# Patient Record
Sex: Male | Born: 1975 | Marital: Married | Smoking: Never smoker
Health system: Southern US, Community
[De-identification: ages and names within clinical notes are randomized; demographics above are authoritative.]

---

## 2013-07-09 ENCOUNTER — Telehealth: Payer: Self-pay | Admitting: *Deleted

## 2013-07-09 ENCOUNTER — Ambulatory Visit (INDEPENDENT_AMBULATORY_CARE_PROVIDER_SITE_OTHER): Payer: 59

## 2013-07-09 ENCOUNTER — Encounter: Payer: Self-pay | Admitting: Sports Medicine

## 2013-07-09 ENCOUNTER — Ambulatory Visit (INDEPENDENT_AMBULATORY_CARE_PROVIDER_SITE_OTHER): Payer: 59 | Admitting: Sports Medicine

## 2013-07-09 VITALS — BP 149/92 | HR 101 | Ht 68.0 in | Wt 234.0 lb

## 2013-07-09 DIAGNOSIS — M545 Low back pain, unspecified: Secondary | ICD-10-CM

## 2013-07-09 DIAGNOSIS — R509 Fever, unspecified: Secondary | ICD-10-CM

## 2013-07-09 DIAGNOSIS — M5416 Radiculopathy, lumbar region: Secondary | ICD-10-CM | POA: Insufficient documentation

## 2013-07-09 MED ORDER — KETOROLAC TROMETHAMINE 30 MG/ML IJ SOLN
30.0000 mg | Freq: Once | INTRAMUSCULAR | Status: AC
  Administered 2013-07-09: 30 mg via INTRAMUSCULAR

## 2013-07-09 MED ORDER — CYCLOBENZAPRINE HCL 10 MG PO TABS: ORAL_TABLET | ORAL | Status: DC

## 2013-07-09 MED ORDER — METHYLPREDNISOLONE SODIUM SUCC 125 MG IJ SOLR
250.0000 mg | Freq: Once | INTRAMUSCULAR | Status: AC
  Administered 2013-07-09: 250 mg via INTRAMUSCULAR

## 2013-07-09 MED ORDER — TRAMADOL HCL 50 MG PO TABS: ORAL_TABLET | ORAL | Status: DC

## 2013-07-09 MED ORDER — MELOXICAM 15 MG PO TABS: ORAL_TABLET | ORAL | Status: DC

## 2013-07-09 MED ORDER — PREDNISONE 50 MG PO TABS: ORAL_TABLET | ORAL | Status: DC

## 2013-07-09 NOTE — Telephone Encounter (Signed)
MRI Lumbar with and w/o Auth # [id number].  Meyer CoryMisty Ahmad, LPN

## 2013-07-09 NOTE — Progress Notes (Signed)
  Subjective:    CC: Low back pain   HPI:  This is a very pleasant 38 year old male with a history of lumbar degenerative disc disease status post discectomy, for the past several days he said exquisite pain that he localizes in the lower back with radiation down the right leg. He does not endorse bowel or bladder dysfunction, he does tell me he has some fevers and chills, is also weak in his lower extremities. No saddle numbness. Symptoms are severe, persistent. No trauma, pain is worse when sitting, with also.  Past medical history, Surgical history, Family history not pertinant except as noted below, Social history, Allergies, and medications have been entered into the medical record, reviewed, and no changes needed.   Review of Systems: No headache, visual changes, nausea, vomiting, diarrhea, constipation, dizziness, abdominal pain, skin rash, fevers, chills, night sweats, swollen lymph nodes, weight loss, chest pain, body aches, joint swelling, muscle aches, shortness of breath, mood changes, visual or auditory hallucinations.  Objective:    General: Well Developed, well nourished, and in no acute distress.  Neuro: Alert and oriented x3, extra-ocular muscles intact, sensation grossly intact.  HEENT: Normocephalic, atraumatic, pupils equal round reactive to light, neck supple, no masses, no lymphadenopathy, thyroid nonpalpable.  Skin: Warm and dry, no rashes noted.  Cardiac: Regular rate and rhythm, no murmurs rubs or gallops.  Respiratory: Clear to auscultation bilaterally. Not using accessory muscles, speaking in full sentences.  Abdominal: Soft, nontender, nondistended, positive bowel sounds, no masses, no organomegaly.  Low back: Due to exquisite back pain, exam was somewhat limited, he did have a positive straight leg raising a positive cross straight leg raise, patellar tendon reflexes were good, Achilles tendon reflexes were nonexistent, he did have positive Babinski's bilaterally.  Good lower extremity strength, some hypoesthesia in the right lower extremity.  Lumbar spine x-rays are negative.  Impression and Recommendations:    The patient was counselled, risk factors were discussed, anticipatory guidance given.

## 2013-07-09 NOTE — Assessment & Plan Note (Signed)
Pain is currently severe with right-sided radicular symptoms, positive Babinski's. 30 of Toradol, 250 a Solu-Medrol. Flexeril at bedtime. X-rays, basic metabolic panel I am going to obtain an early MRI with IV contrast due to a prior discectomy. Return to see me go over results of the MRI.

## 2013-07-10 ENCOUNTER — Encounter: Payer: Self-pay | Admitting: Sports Medicine

## 2013-07-10 ENCOUNTER — Ambulatory Visit (INDEPENDENT_AMBULATORY_CARE_PROVIDER_SITE_OTHER): Payer: 59 | Admitting: Sports Medicine

## 2013-07-10 ENCOUNTER — Ambulatory Visit (INDEPENDENT_AMBULATORY_CARE_PROVIDER_SITE_OTHER): Payer: 59

## 2013-07-10 VITALS — BP 156/90 | HR 91 | Ht 68.0 in | Wt 237.0 lb

## 2013-07-10 DIAGNOSIS — M545 Low back pain, unspecified: Secondary | ICD-10-CM

## 2013-07-10 MED ORDER — OXYCODONE-ACETAMINOPHEN 10-325 MG PO TABS: 1.0000 | ORAL_TABLET | Freq: Three times a day (TID) | ORAL | Status: DC | PRN

## 2013-07-10 MED ORDER — KETOROLAC TROMETHAMINE 30 MG/ML IJ SOLN
30.0000 mg | Freq: Once | INTRAMUSCULAR | Status: AC
  Administered 2013-07-10: 30 mg via INTRAMUSCULAR

## 2013-07-10 MED ORDER — GADOBENATE DIMEGLUMINE 529 MG/ML IV SOLN: 20.0000 mL | Freq: Once | INTRAVENOUS | Status: AC | PRN

## 2013-07-10 NOTE — Addendum Note (Signed)
Addended by: Pixie CasinoUNNINGHAM, Harnoor Kohles C on: 07/10/2013 04:37 PM   Modules accepted: Orders

## 2013-07-10 NOTE — Assessment & Plan Note (Signed)
MRI does confirm a large right-sided L5-S1 disc protrusion. Fortunately there is no sign of cauda equina syndrome or spinal cord compression. Toradol was tremendously useful yesterday, we are going to repeat this. Note out of work for this week, Percocet, return to see me in 2 weeks, if no better certainly we would need to consider a right-sided L5-S1 interlaminar epidural steroid injection.

## 2013-07-10 NOTE — Progress Notes (Signed)
  Subjective:    CC: MRI results  HPI: Jamie Watts is a very pleasant 38 year old male who was referred to me by another one of my patients. I saw him yesterday with severe back pain, he did have a positive Babinski sign. We obtained an early MRI due to these physical exam findings, he has a history of an L5-S1 microdiscectomy. Overall he is improved by about 10% yesterday with steroids, NSAIDs, muscle relaxers. He is continuing to be severe and persistent.  Past medical history, Surgical history, Family history not pertinant except as noted below, Social history, Allergies, and medications have been entered into the medical record, reviewed, and no changes needed.   Review of Systems: No fevers, chills, night sweats, weight loss, chest pain, or shortness of breath.   Objective:    General: Well Developed, well nourished, and in no acute distress.  Neuro: Alert and oriented x3, extra-ocular muscles intact, sensation grossly intact.  HEENT: Normocephalic, atraumatic, pupils equal round reactive to light, neck supple, no masses, no lymphadenopathy, thyroid nonpalpable.  Skin: Warm and dry, no rashes. Cardiac: Regular rate and rhythm, no murmurs rubs or gallops, no lower extremity edema.  Respiratory: Clear to auscultation bilaterally. Not using accessory muscles, speaking in full sentences.  MRI did show recurrence of this protrusion on the right at the L5-S1 level, protruding in to the neural foramen as well as causing mild central canal and lateral recess stenosis.  Toradol 30 mg intramuscular given today.  Impression and Recommendations:

## 2013-07-20 ENCOUNTER — Encounter: Payer: Self-pay | Admitting: Sports Medicine

## 2013-07-20 ENCOUNTER — Ambulatory Visit (INDEPENDENT_AMBULATORY_CARE_PROVIDER_SITE_OTHER): Payer: 59 | Admitting: Sports Medicine

## 2013-07-20 VITALS — BP 138/97 | HR 84 | Ht 68.0 in | Wt 228.0 lb

## 2013-07-20 DIAGNOSIS — IMO0002 Reserved for concepts with insufficient information to code with codable children: Secondary | ICD-10-CM

## 2013-07-20 DIAGNOSIS — M5416 Radiculopathy, lumbar region: Secondary | ICD-10-CM

## 2013-07-20 NOTE — Assessment & Plan Note (Signed)
Fantastic response to Flexeril,  Percocet, Mobic, and high-dose steroids. At this point he continues to improve, still gets occasional radicular symptoms down the right leg in an L5 distribution. We are not going to rock the boat today, I like to see him back in one month if he continues to have symptoms he would be a candidate for a right-sided L5-S1 interlaminar epidural injection. 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

## 2013-07-20 NOTE — Progress Notes (Signed)
  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 He is a history of a L5-S1 microdiscectomy, unfortunately had recurrence of pain, exquisite, with radiation down the right leg. I placed him through Percocet, steroids, NSAIDs, muscle relaxers, and home rehabilitation. Fortunately he has significantly improved, symptoms are also continuing to improve and he is very happy with the results so far. He still has numbness and tingling down the right leg but this also continues to improve.  Past medical history, Surgical history, Family history not pertinant except as noted below, Social history, Allergies, and medications have been entered into the medical record, reviewed, and no changes needed.   Review of Systems: No fevers, chills, night sweats, weight loss, chest pain, or shortness of breath.   Objective:    General: Well Developed, well nourished, and in no acute distress.  Neuro: Alert and oriented x3, extra-ocular muscles intact, sensation grossly intact.  HEENT: Normocephalic, atraumatic, pupils equal round reactive to light, neck supple, no masses, no lymphadenopathy, thyroid nonpalpable.  Skin: Warm and dry, no rashes. Cardiac: Regular rate and rhythm, no murmurs rubs or gallops, no lower extremity edema.  Respiratory: Clear to auscultation bilaterally. Not using accessory muscles, speaking in full sentences. Back Exam:  Inspection: Unremarkable  Motion: Flexion 45 deg, Extension 45 deg, Side Bending to 45 deg bilaterally,  Rotation to 45 deg bilaterally  SLR laying: Negative  XSLR laying: Negative  Palpable tenderness: None. FABER: negative. Sensory change: Gross sensation intact to all lumbar and sacral dermatomes.  Reflexes: 2+ at both patellar tendons, 2+ at achilles tendons, Babinski's downgoing.  Strength at foot  Plantar-flexion: 5/5 Dorsi-flexion: 5/5 Eversion: 5/5 Inversion: 5/5  Leg  strength  Quad: 5/5 Hamstring: 5/5 Hip flexor: 5/5 Hip abductors: 5/5  Gait unremarkable.  Impression and Recommendations:

## 2013-07-27 ENCOUNTER — Encounter: Payer: Self-pay | Admitting: Sports Medicine

## 2013-07-27 ENCOUNTER — Ambulatory Visit (INDEPENDENT_AMBULATORY_CARE_PROVIDER_SITE_OTHER): Payer: 59 | Admitting: Sports Medicine

## 2013-07-27 VITALS — BP 157/103 | HR 104 | Ht 68.0 in | Wt 223.0 lb

## 2013-07-27 DIAGNOSIS — M5416 Radiculopathy, lumbar region: Secondary | ICD-10-CM

## 2013-07-27 DIAGNOSIS — IMO0002 Reserved for concepts with insufficient information to code with codable children: Secondary | ICD-10-CM

## 2013-07-27 MED ORDER — OXYCODONE-ACETAMINOPHEN 10-325 MG PO TABS: 1.0000 | ORAL_TABLET | Freq: Three times a day (TID) | ORAL | Status: DC | PRN

## 2013-07-27 MED ORDER — MELOXICAM 15 MG PO TABS: ORAL_TABLET | ORAL | Status: DC

## 2013-07-27 NOTE — Assessment & Plan Note (Signed)
Length is doing very well initially, he does have a large recurrent L5-S1 disc protrusion that does appear to be affecting the right-sided S1 nerve root. At this point I would like to proceed with a right-sided L5-S1 transforaminal epidural injection. His symptoms are all radicular, he has no axial pain.

## 2013-07-27 NOTE — Progress Notes (Signed)
  Subjective:    CC: Follow up  HPI: Lumbar radiculopathy: Jamie Watts is status post discectomy of L5-S1 disc protrusion, more recently he suffered a recurrence, repeat MRI with contrast did show recurrent disc herniation likely affecting the right-sided S1 nerve root. He does have pain that is predominantly radicular, very little axial symptoms at all. It radiates down the posterior aspect of his right leg, around the lateral aspect of his right lower leg, into the lateral aspect of his right foot. Symptoms are worse with sitting, flexion, and Valsalva. Moderate, persistent, no bowel or bladder dysfunction.  Past medical history, Surgical history, Family history not pertinant except as noted below, Social history, Allergies, and medications have been entered into the medical record, reviewed, and no changes needed.   Review of Systems: No fevers, chills, night sweats, weight loss, chest pain, or shortness of breath.   Objective:    General: Well Developed, well nourished, and in no acute distress.  Neuro: Alert and oriented x3, extra-ocular muscles intact, sensation grossly intact.  HEENT: Normocephalic, atraumatic, pupils equal round reactive to light, neck supple, no masses, no lymphadenopathy, thyroid nonpalpable.  Skin: Warm and dry, no rashes. Cardiac: Regular rate and rhythm, no murmurs rubs or gallops, no lower extremity edema.  Respiratory: Clear to auscultation bilaterally. Not using accessory muscles, speaking in full sentences.  Impression and Recommendations:

## 2013-07-31 ENCOUNTER — Ambulatory Visit
Admission: RE | Admit: 2013-07-31 | Discharge: 2013-07-31 | Disposition: A | Discharge status: Home / Self Care | Payer: 59 | Source: Ambulatory Visit | Attending: Sports Medicine | Admitting: Sports Medicine

## 2013-07-31 VITALS — BP 178/83 | HR 90

## 2013-07-31 DIAGNOSIS — M5416 Radiculopathy, lumbar region: Secondary | ICD-10-CM

## 2013-07-31 MED ORDER — METHYLPREDNISOLONE ACETATE 40 MG/ML INJ SUSP (RADIOLOG
120.0000 mg | Freq: Once | INTRAMUSCULAR | Status: AC
  Administered 2013-07-31: 120 mg via EPIDURAL

## 2013-07-31 MED ORDER — IOHEXOL 180 MG/ML  SOLN
1.0000 mL | Freq: Once | INTRAMUSCULAR | Status: AC | PRN
  Administered 2013-07-31: 1 mL via EPIDURAL

## 2013-07-31 NOTE — Discharge Instructions (Signed)

## 2013-08-17 ENCOUNTER — Encounter: Payer: Self-pay | Admitting: Sports Medicine

## 2013-08-17 ENCOUNTER — Ambulatory Visit (INDEPENDENT_AMBULATORY_CARE_PROVIDER_SITE_OTHER): Payer: 59 | Admitting: Sports Medicine

## 2013-08-17 VITALS — BP 124/81 | HR 78 | Wt 218.0 lb

## 2013-08-17 DIAGNOSIS — Z299 Encounter for prophylactic measures, unspecified: Secondary | ICD-10-CM

## 2013-08-17 DIAGNOSIS — IMO0002 Reserved for concepts with insufficient information to code with codable children: Secondary | ICD-10-CM

## 2013-08-17 DIAGNOSIS — Z0000 Encounter for general adult medical examination without abnormal findings: Secondary | ICD-10-CM | POA: Insufficient documentation

## 2013-08-17 DIAGNOSIS — M5416 Radiculopathy, lumbar region: Secondary | ICD-10-CM

## 2013-08-17 NOTE — Progress Notes (Signed)
  Subjective:    CC: Followup after epidural  HPI: Jamie Watts is a pleasant 38 year old male, he had a previous L5-S1 microdiscectomy with a reherniation on MRI with contrast. We placed him through physical therapy, steroids, NSAIDs, muscle relaxers, and pain remained recurrent. More recently we sent him for a selective S1 nerve block on the right side, 2 days after the injection he was pain-free. He is happy with the results so far. He is now joining the gym, and working with a therapist that specializes in core strengthening, and is eager to return to a healthy lifestyle. He does desire to establish care with me.  Past medical history, Surgical history, Family history not pertinant except as noted below, Social history, Allergies, and medications have been entered into the medical record, reviewed, and no changes needed.   Review of Systems: No fevers, chills, night sweats, weight loss, chest pain, or shortness of breath.   Objective:    General: Well Developed, well nourished, and in no acute distress.  Neuro: Alert and oriented x3, extra-ocular muscles intact, sensation grossly intact.  HEENT: Normocephalic, atraumatic, pupils equal round reactive to light, neck supple, no masses, no lymphadenopathy, thyroid nonpalpable.  Skin: Warm and dry, no rashes. Cardiac: Regular rate and rhythm, no murmurs rubs or gallops, no lower extremity edema.  Respiratory: Clear to auscultation bilaterally. Not using accessory muscles, speaking in full sentences. Back Exam:  Inspection: Unremarkable  Motion: Flexion 45 deg, Extension 45 deg, Side Bending to 45 deg bilaterally,  Rotation to 45 deg bilaterally  SLR laying: Negative  XSLR laying: Negative  Palpable tenderness: None. FABER: negative. Sensory change: Gross sensation intact to all lumbar and sacral dermatomes.  Reflexes: 2+ at both patellar tendons, 2+ at achilles tendons, Babinski's downgoing.  Strength at foot  Plantar-flexion: 5/5  Dorsi-flexion: 5/5 Eversion: 5/5 Inversion: 5/5  Leg strength  Quad: 5/5 Hamstring: 5/5 Hip flexor: 5/5 Hip abductors: 5/5  Gait unremarkable.  Impression and Recommendations:

## 2013-08-17 NOTE — Assessment & Plan Note (Signed)
Patient does desire to establish care with me, he can return in 6 months and we can discuss weight loss medications.

## 2013-08-17 NOTE — Assessment & Plan Note (Signed)
Resolved with selective right-sided S1 nerve root block.

## 2014-02-18 ENCOUNTER — Encounter: Payer: Self-pay | Admitting: Sports Medicine

## 2014-02-18 ENCOUNTER — Ambulatory Visit (INDEPENDENT_AMBULATORY_CARE_PROVIDER_SITE_OTHER): Payer: 59 | Admitting: Sports Medicine

## 2014-02-18 VITALS — BP 141/86 | HR 81 | Ht 70.0 in | Wt 229.0 lb

## 2014-02-18 DIAGNOSIS — K7581 Nonalcoholic steatohepatitis (NASH): Secondary | ICD-10-CM | POA: Insufficient documentation

## 2014-02-18 DIAGNOSIS — IMO0002 Reserved for concepts with insufficient information to code with codable children: Secondary | ICD-10-CM

## 2014-02-18 DIAGNOSIS — M5416 Radiculopathy, lumbar region: Secondary | ICD-10-CM

## 2014-02-18 DIAGNOSIS — K7689 Other specified diseases of liver: Secondary | ICD-10-CM

## 2014-02-18 DIAGNOSIS — Z23 Encounter for immunization: Secondary | ICD-10-CM

## 2014-02-18 DIAGNOSIS — Z0000 Encounter for general adult medical examination without abnormal findings: Secondary | ICD-10-CM

## 2014-02-18 NOTE — Assessment & Plan Note (Signed)
With elevated LFTs on a recent work blood panel, ultrasound showed fatty liver. He is working on weight loss and improving his diet. We discussed weight loss medication, we will discuss this at a future visit.

## 2014-02-18 NOTE — Progress Notes (Signed)
  Subjective:    CC: Reestablish care  HPI: Seen Lum multiple times for lumbar degenerative disc disease which has since resolved after an epidural.  For the most part he is a healthy male, he does have some obesity but she would like to work on nonpharmacologically. He works as a Careers adviser.  Steatohepatitis: Understands that weight loss and improved diet well.  Preventative measures: Desires tetanus and flu vaccination today.  Past medical history, Surgical history, Family history not pertinant except as noted below, Social history, Allergies, and medications have been entered into the medical record, reviewed, and no changes needed.   Review of Systems: No fevers, chills, night sweats, weight loss, chest pain, or shortness of breath.   Objective:    General: Well Developed, well nourished, and in no acute distress.  Neuro: Alert and oriented x3, extra-ocular muscles intact, sensation grossly intact. Cranial nerves II through XII are intact, motor, sensory, and coordinative functions are all intact. HEENT: Normocephalic, atraumatic, pupils equal round reactive to light, neck supple, no masses, no lymphadenopathy, thyroid nonpalpable. Oropharynx, nasopharynx, external ear canals are unremarkable. Skin: Warm and dry, no rashes noted.  Cardiac: Regular rate and rhythm, no murmurs rubs or gallops.  Respiratory: Clear to auscultation bilaterally. Not using accessory muscles, speaking in full sentences.  Abdominal: Soft, nontender, nondistended, positive bowel sounds, no masses, no organomegaly.  Musculoskeletal: Shoulder, elbow, wrist, hip, knee, ankle stable, and with full range of motion.  Impression and Recommendations:

## 2014-02-18 NOTE — Assessment & Plan Note (Signed)
Flu and JamieWatts Kitchen

## 2014-02-18 NOTE — Assessment & Plan Note (Signed)
Continues to be resolved after right-sided S1 selective nerve block in February 2015.

## 2015-12-17 ENCOUNTER — Encounter: Payer: Self-pay | Admitting: Sports Medicine

## 2015-12-17 ENCOUNTER — Ambulatory Visit (INDEPENDENT_AMBULATORY_CARE_PROVIDER_SITE_OTHER): Payer: 59 | Admitting: Sports Medicine

## 2015-12-17 VITALS — BP 148/92 | HR 102 | Resp 18 | Wt 248.9 lb

## 2015-12-17 DIAGNOSIS — M5416 Radiculopathy, lumbar region: Secondary | ICD-10-CM

## 2015-12-17 DIAGNOSIS — Z0000 Encounter for general adult medical examination without abnormal findings: Secondary | ICD-10-CM | POA: Diagnosis not present

## 2015-12-17 DIAGNOSIS — R635 Abnormal weight gain: Secondary | ICD-10-CM | POA: Diagnosis not present

## 2015-12-17 MED ORDER — PHENTERMINE HCL 37.5 MG PO TABS: ORAL_TABLET | ORAL | Status: DC

## 2015-12-17 NOTE — Assessment & Plan Note (Signed)
Starting phentermine, exercise prescription given, return monthly for weight checks and refills. 

## 2015-12-17 NOTE — Assessment & Plan Note (Signed)
Checking routine bloodwork. 

## 2015-12-17 NOTE — Assessment & Plan Note (Signed)
Recurrence of radicular, repeat right-sided S1 selective nerve block.

## 2015-12-17 NOTE — Progress Notes (Signed)
  Subjective:    CC: Low back pain  HPI: This is a pleasant 40 year old male with lumbar degenerative disc disease, and right-sided S1 radiculopathy, previous epidural was about 2 years ago, right selective S1 block, he is having recurrence of pain, has done some rehabilitation exercises at home without any improvement, desires repeat interventional treatment.  Obesity: Has tried dieting, exercise, unable to lose any weight, would like pharmacologic assistance.  Past medical history, Surgical history, Family history not pertinant except as noted below, Social history, Allergies, and medications have been entered into the medical record, reviewed, and no changes needed.   Review of Systems: No fevers, chills, night sweats, weight loss, chest pain, or shortness of breath.   Objective:    General: Well Developed, well nourished, and in no acute distress.  Neuro: Alert and oriented x3, extra-ocular muscles intact, sensation grossly intact.  HEENT: Normocephalic, atraumatic, pupils equal round reactive to light, neck supple, no masses, no lymphadenopathy, thyroid nonpalpable.  Skin: Warm and dry, no rashes. Cardiac: Regular rate and rhythm, no murmurs rubs or gallops, no lower extremity edema.  Respiratory: Clear to auscultation bilaterally. Not using accessory muscles, speaking in full sentences.  Impression and Recommendations:    I spent 40 minutes with this patient, greater than 50% was face-to-face time counseling regarding the above diagnoses

## 2015-12-18 ENCOUNTER — Ambulatory Visit: Payer: Self-pay | Admitting: Sports Medicine

## 2015-12-19 LAB — CBC
HCT: 47.1 % (ref 38.5–50.0)
Hemoglobin: 15.9 g/dL (ref 13.2–17.1)
MCH: 30.5 pg (ref 27.0–33.0)
MCHC: 33.8 g/dL (ref 32.0–36.0)
MCV: 90.2 fL (ref 80.0–100.0)
MPV: 11.1 fL (ref 7.5–12.5)
Platelets: 315 K/uL (ref 140–400)
RBC: 5.22 MIL/uL (ref 4.20–5.80)
RDW: 13.9 % (ref 11.0–15.0)
WBC: 6.3 10*3/uL (ref 3.8–10.8)

## 2015-12-19 LAB — TESTOSTERONE: Testosterone: 255 ng/dL (ref 250–827)

## 2015-12-19 LAB — LIPID PANEL
Cholesterol: 188 mg/dL (ref 125–200)
HDL: 36 mg/dL — ABNORMAL LOW (ref >=40)
LDL Cholesterol: 120 mg/dL (ref <130)
Total CHOL/HDL Ratio: 5.2 Ratio — ABNORMAL HIGH (ref <=5.0)
Triglycerides: 162 mg/dL — ABNORMAL HIGH (ref <150)
VLDL: 32 mg/dL — ABNORMAL HIGH (ref <30)

## 2015-12-19 LAB — HEMOGLOBIN A1C
Hgb A1c MFr Bld: 5.7 % — ABNORMAL HIGH (ref <5.7)
Mean Plasma Glucose: 117 mg/dL

## 2015-12-19 LAB — COMPREHENSIVE METABOLIC PANEL WITH GFR
ALT: 93 U/L — ABNORMAL HIGH (ref 9–46)
Alkaline Phosphatase: 66 U/L (ref 40–115)
BUN: 14 mg/dL (ref 7–25)
Calcium: 9.1 mg/dL (ref 8.6–10.3)
Glucose, Bld: 111 mg/dL — ABNORMAL HIGH (ref 65–99)
Potassium: 4.1 mmol/L (ref 3.5–5.3)
Total Bilirubin: 0.7 mg/dL (ref 0.2–1.2)
Total Protein: 6.9 g/dL (ref 6.1–8.1)

## 2015-12-19 LAB — COMPREHENSIVE METABOLIC PANEL
AST: 31 U/L (ref 10–40)
Albumin: 4.5 g/dL (ref 3.6–5.1)
CO2: 25 mmol/L (ref 20–31)
Chloride: 104 mmol/L (ref 98–110)
Creat: 0.95 mg/dL (ref 0.60–1.35)
Sodium: 141 mmol/L (ref 135–146)

## 2015-12-19 LAB — TSH: TSH: 2.64 m[IU]/L (ref 0.40–4.50)

## 2015-12-20 LAB — VITAMIN D 25 HYDROXY (VIT D DEFICIENCY, FRACTURES): Vit D, 25-Hydroxy: 23 ng/mL — ABNORMAL LOW (ref 30–100)

## 2015-12-22 MED ORDER — VITAMIN D (ERGOCALCIFEROL) 1.25 MG (50000 UNIT) PO CAPS: 50000.0000 [IU] | ORAL_CAPSULE | ORAL | 0 refills | Status: DC

## 2015-12-22 NOTE — Addendum Note (Signed)
Addended by: Monica Becton on: 12/22/2015 09:00 AM   Modules accepted: Orders

## 2015-12-24 ENCOUNTER — Ambulatory Visit
Admission: RE | Admit: 2015-12-24 | Discharge: 2015-12-24 | Disposition: A | Discharge status: Home / Self Care | Payer: 59 | Source: Ambulatory Visit | Attending: Sports Medicine | Admitting: Sports Medicine

## 2015-12-24 DIAGNOSIS — M5416 Radiculopathy, lumbar region: Secondary | ICD-10-CM

## 2015-12-24 MED ORDER — METHYLPREDNISOLONE ACETATE 40 MG/ML INJ SUSP (RADIOLOG
120.0000 mg | Freq: Once | INTRAMUSCULAR | Status: AC
  Administered 2015-12-24: 120 mg via EPIDURAL

## 2015-12-24 MED ORDER — IOPAMIDOL (ISOVUE-M 200) INJECTION 41%
1.0000 mL | Freq: Once | INTRAMUSCULAR | Status: AC
  Administered 2015-12-24: 1 mL via EPIDURAL

## 2015-12-24 NOTE — Discharge Instructions (Signed)

## 2016-01-29 ENCOUNTER — Ambulatory Visit (INDEPENDENT_AMBULATORY_CARE_PROVIDER_SITE_OTHER): Payer: 59 | Admitting: Sports Medicine

## 2016-01-29 DIAGNOSIS — R635 Abnormal weight gain: Secondary | ICD-10-CM | POA: Diagnosis not present

## 2016-01-29 MED ORDER — PHENTERMINE HCL 37.5 MG PO TABS: ORAL_TABLET | ORAL | 0 refills | Status: DC

## 2016-01-29 NOTE — Patient Instructions (Signed)
Pt advised to make next appointment in 30 days. 

## 2016-01-29 NOTE — Progress Notes (Signed)
   Subjective:    Patient ID: Jamie Watts, male    DOB: 07/28/1975, 40 y.o.   MRN: [id number]  HPI  Jamie Watts presents to clinic for a monthly weight and blood pressure check. Denies SOB, chest pain, heart palpitations.   Review of Systems     Objective:   Physical Exam        Assessment & Plan:  Pt lost 12 lbs and was advised to schedule his next appointment in 30 days. -[facility]

## 2016-02-19 ENCOUNTER — Other Ambulatory Visit: Payer: Self-pay | Admitting: Sports Medicine

## 2016-12-20 ENCOUNTER — Ambulatory Visit (INDEPENDENT_AMBULATORY_CARE_PROVIDER_SITE_OTHER): Payer: BLUE CROSS/BLUE SHIELD | Admitting: Sports Medicine

## 2016-12-20 ENCOUNTER — Encounter: Payer: Self-pay | Admitting: Sports Medicine

## 2016-12-20 DIAGNOSIS — J0100 Acute maxillary sinusitis, unspecified: Secondary | ICD-10-CM

## 2016-12-20 MED ORDER — PREDNISONE 50 MG PO TABS: 50.0000 mg | ORAL_TABLET | Freq: Every day | ORAL | 0 refills | Status: DC

## 2016-12-20 MED ORDER — AZITHROMYCIN 250 MG PO TABS: ORAL_TABLET | ORAL | 0 refills | Status: DC

## 2016-12-20 MED ORDER — HYDROCOD POLST-CPM POLST ER 10-8 MG/5ML PO SUER: 5.0000 mL | Freq: Two times a day (BID) | ORAL | 0 refills | Status: DC | PRN

## 2016-12-20 NOTE — Patient Instructions (Signed)

## 2016-12-20 NOTE — Progress Notes (Signed)
  Subjective:    CC:  Feeling sick  HPI: For 10 days this pleasant 41 year old male has had increasing runny nose, cough, sinus pressure, fevers and chills. Symptoms are severe, worsening, no shortness of breath, no chest pain, no skin rash, no GI symptoms.  Past medical history:  Negative.  See flowsheet/record as well for more information.  Surgical history: Negative.  See flowsheet/record as well for more information.  Family history: Negative.  See flowsheet/record as well for more information.  Social history: Negative.  See flowsheet/record as well for more information.  Allergies, and medications have been entered into the medical record, reviewed, and no changes needed.   Review of Systems: No fevers, chills, night sweats, weight loss, chest pain, or shortness of breath.   Objective:    General: Well Developed, well nourished, and in no acute distress.  Neuro: Alert and oriented x3, extra-ocular muscles intact, sensation grossly intact.  HEENT: Normocephalic, atraumatic, pupils equal round reactive to light, neck supple, no masses, no lymphadenopathy, thyroid nonpalpable. Oropharynx, nasopharynx, ear canals unremarkable with the exception of some left tympanic membrane erythema, rightward deviated septum and significant turbinate hypertrophy, fogginess, and mucous discharge. Skin: Warm and dry, no rashes. Cardiac: Regular rate and rhythm, no murmurs rubs or gallops, no lower extremity edema.  Respiratory: Clear to auscultation bilaterally. Not using accessory muscles, speaking in full sentences.  Impression and Recommendations:    Acute non-recurrent maxillary sinusitis Prednisone, azithromycin, Tussionex. Return if no better in a week.  I spent 25 minutes with this patient, greater than 50% was face-to-face time counseling regarding the above diagnoses

## 2016-12-20 NOTE — Assessment & Plan Note (Addendum)
Prednisone, azithromycin, Tussionex. Return if no better in a week.  There is moderate to severe bilateral maxillary sinusitis. I'm going to add another course of prednisone in a taper, as well as 10 days of Augmentin, if this still doesn't knock it out we will have ENT do a sinuplasty.

## 2016-12-27 ENCOUNTER — Telehealth: Payer: Self-pay

## 2016-12-27 DIAGNOSIS — J329 Chronic sinusitis, unspecified: Secondary | ICD-10-CM

## 2016-12-27 NOTE — Telephone Encounter (Signed)
Pt left VM stating he's not feeling any better after antibiotics and steroid. Would like to know if there's anything else he should try at this point or if he needs a f/u.

## 2016-12-27 NOTE — Telephone Encounter (Signed)
We should probably get a CT of the sinuses, if this is okay with him I will order it.

## 2016-12-28 NOTE — Telephone Encounter (Signed)
Pt states he would like to have CT done. Please assist.

## 2016-12-28 NOTE — Telephone Encounter (Signed)
Ct sinus ordered

## 2016-12-28 NOTE — Telephone Encounter (Signed)
Left VM with information and asked pt to call back and verify.

## 2016-12-30 ENCOUNTER — Ambulatory Visit (INDEPENDENT_AMBULATORY_CARE_PROVIDER_SITE_OTHER): Payer: BLUE CROSS/BLUE SHIELD

## 2016-12-30 DIAGNOSIS — J329 Chronic sinusitis, unspecified: Secondary | ICD-10-CM

## 2016-12-30 DIAGNOSIS — R200 Anesthesia of skin: Secondary | ICD-10-CM | POA: Diagnosis not present

## 2016-12-30 DIAGNOSIS — J320 Chronic maxillary sinusitis: Secondary | ICD-10-CM | POA: Diagnosis not present

## 2016-12-30 IMAGING — CT CT MAXILLOFACIAL W/O CM
3 series · 16 of 33 positions shown, 19 images · non-contrast
Comparison: None.

CLINICAL DATA: Facial numbness for 2 weeks without known injury.

EXAM:
CT MAXILLOFACIAL WITHOUT CONTRAST
TECHNIQUE: Multidetector CT imaging of the maxillofacial structures was
performed. Multiplanar CT image reconstructions were also generated.

[Series 2: max soft · axial · 0.33mm/px · z∈[-205,-57]mm · 10 of 88 slices shown, 13 images]
[im 7/88  soft-tissue]
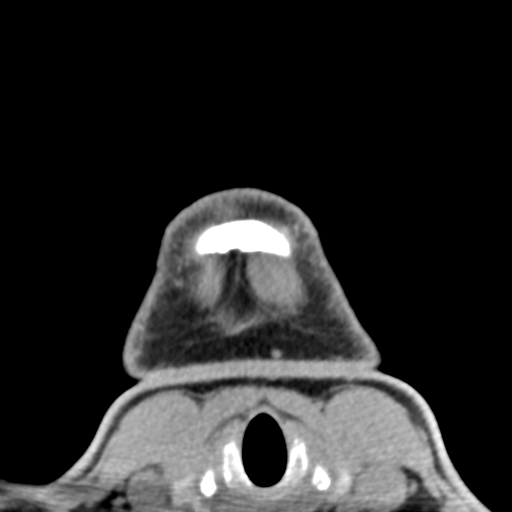
[im 7/88  bone]
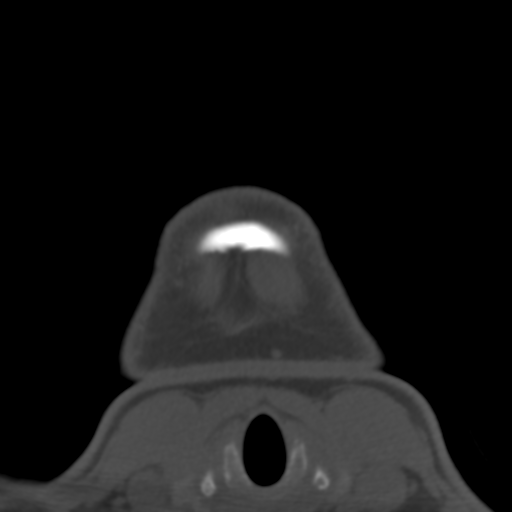
[im 14/88  bone]
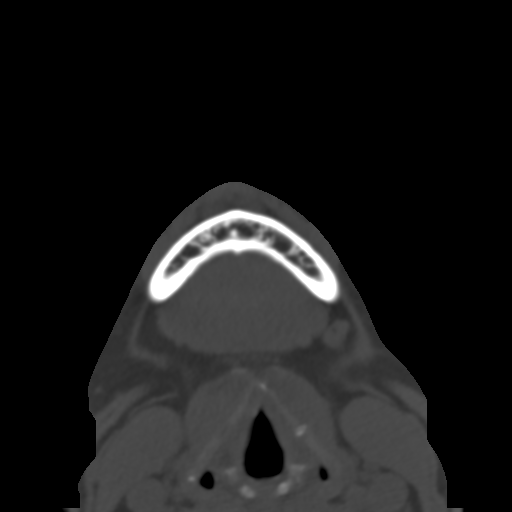
[im 27/88  bone]
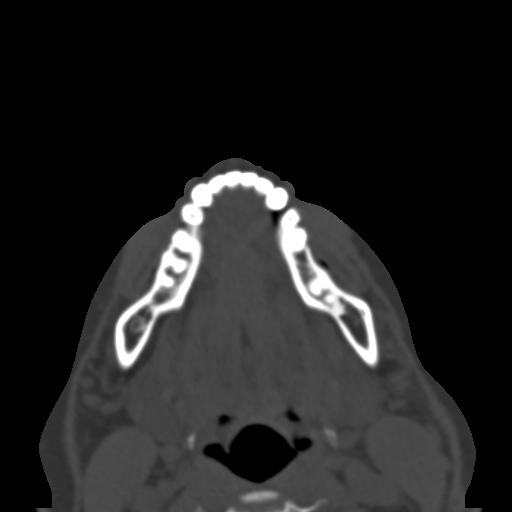
[im 34/88  bone]
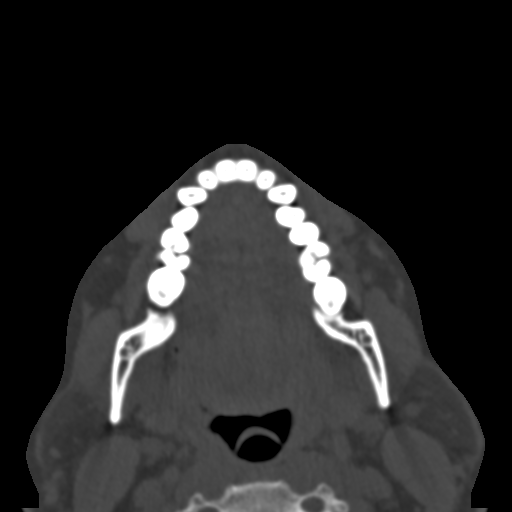
[im 41/88  soft-tissue]
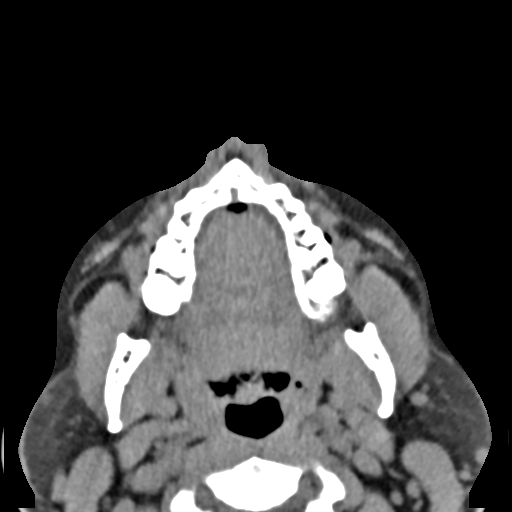
[im 41/88  bone]
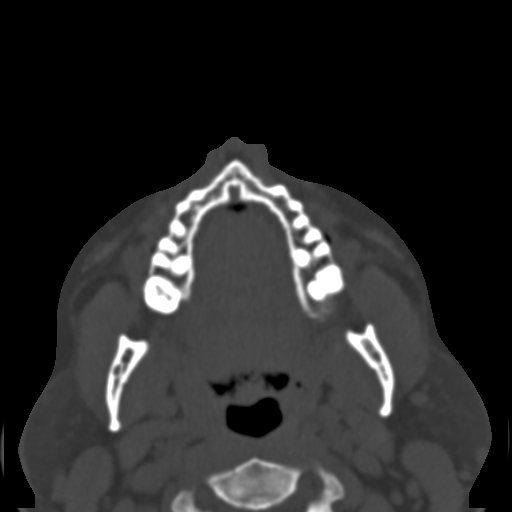
[im 47/88  bone]
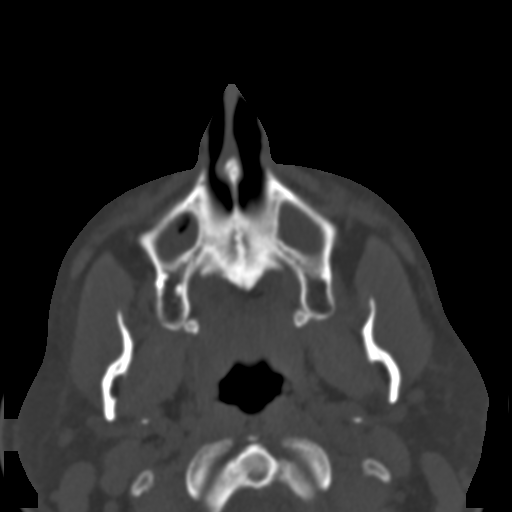
[im 54/88  bone]
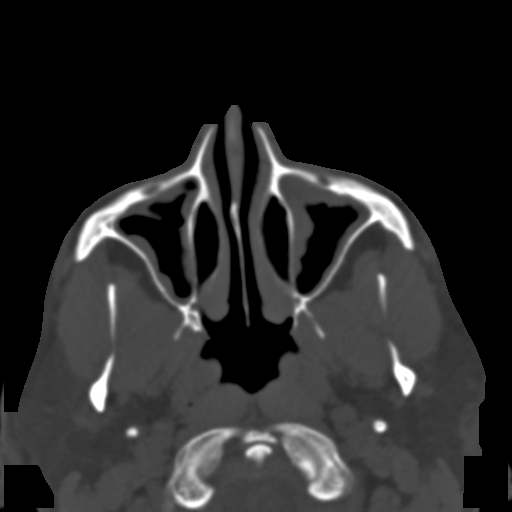
[im 67/88  bone]
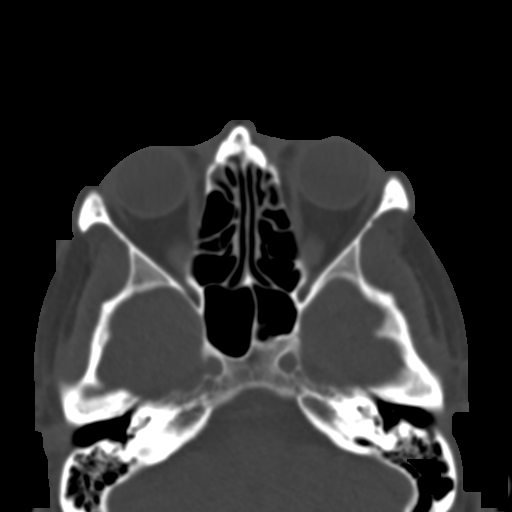
[im 74/88  soft-tissue]
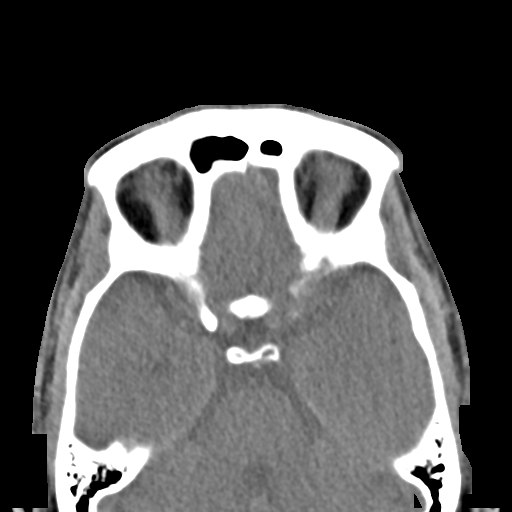
[im 74/88  bone]
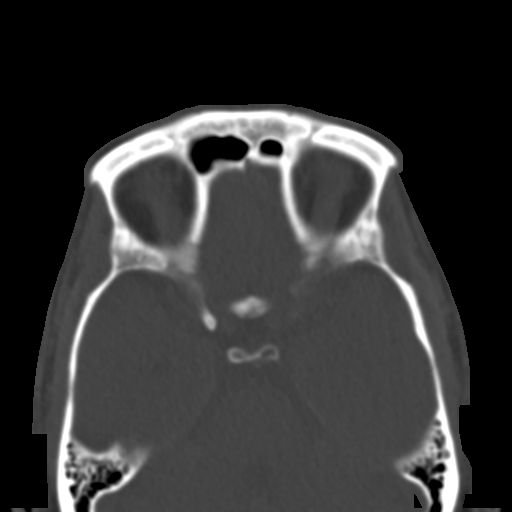
[im 81/88  bone]
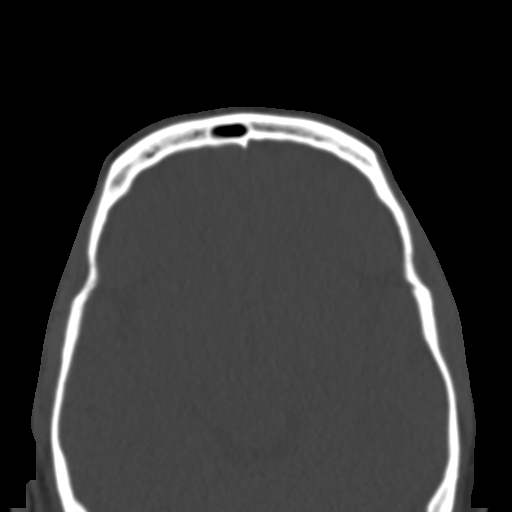

[Series 4: coronal soft · coronal · 0.34mm/px · 3 of 86 slices shown]
[im 18/86  bone]
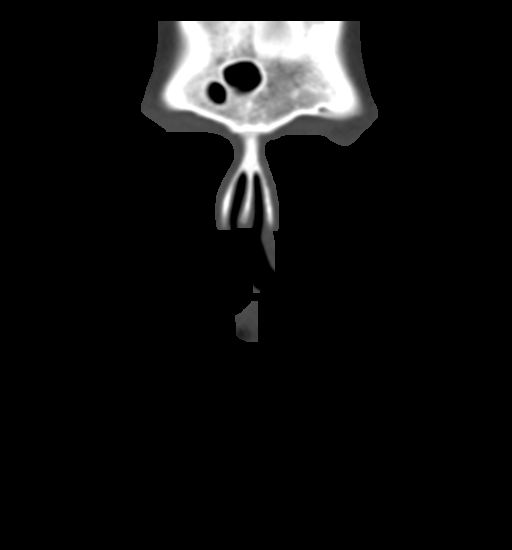
[im 35/86  bone]
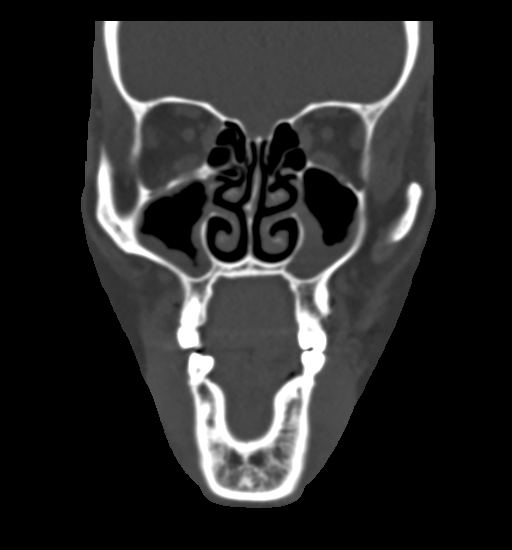
[im 52/86  bone]
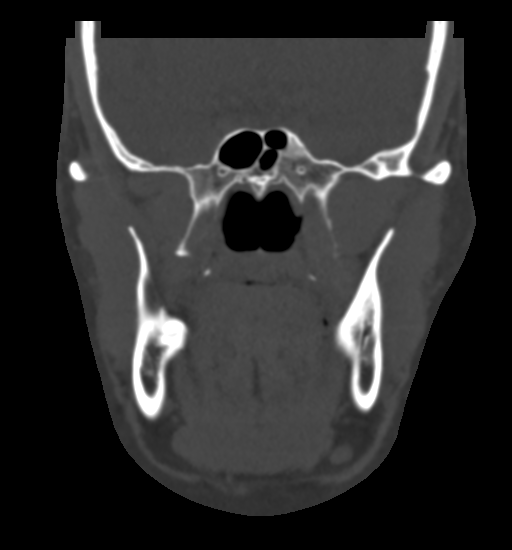

[Series 5: sagittal soft · sagittal · 0.36mm/px · 3 of 89 slices shown]
[im 37/89  bone]
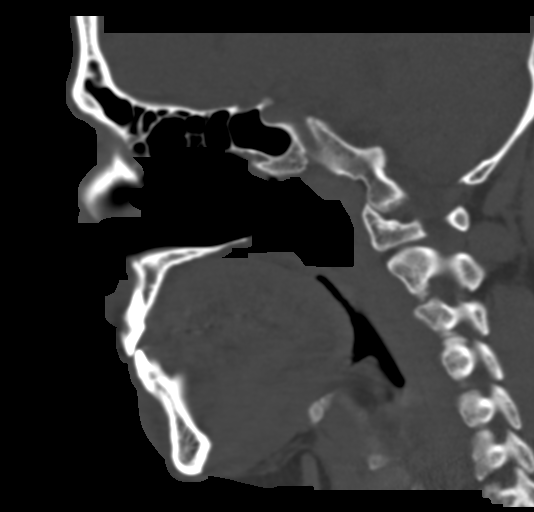
[im 45/89  bone]
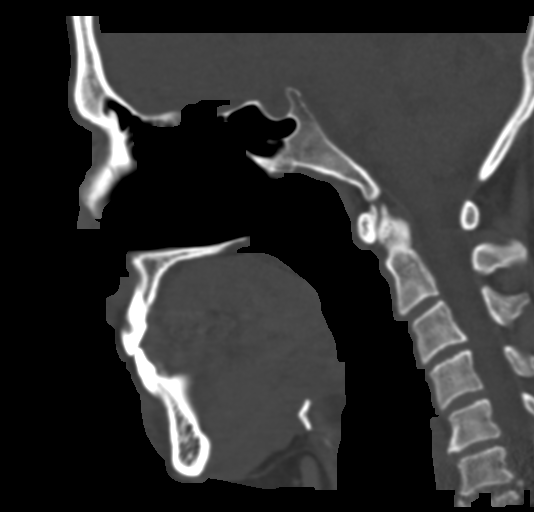
[im 52/89  bone]
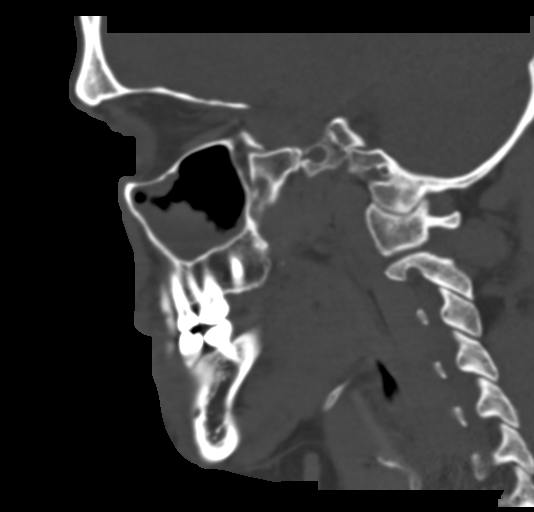

[16 of 33 positions shown; findings below may reference images not displayed]

FINDINGS: Osseous: No fracture or mandibular dislocation. No destructive
process.

Orbits: Negative. No traumatic or inflammatory finding.

Sinuses: Moderate bilateral maxillary sinusitis is noted. Remaining
sinuses are unremarkable.

Soft tissues: Negative.

Limited intracranial: No significant or unexpected finding.
IMPRESSION: Moderate bilateral maxillary sinusitis.

## 2016-12-30 MED ORDER — AMOXICILLIN-POT CLAVULANATE 875-125 MG PO TABS: 1.0000 | ORAL_TABLET | Freq: Two times a day (BID) | ORAL | 0 refills | Status: AC

## 2016-12-30 MED ORDER — PREDNISONE 10 MG (48) PO TBPK: ORAL_TABLET | Freq: Every day | ORAL | 0 refills | Status: DC

## 2016-12-30 NOTE — Addendum Note (Signed)
Addended by: Monica BectonHEKKEKANDAM, THOMAS J on: 12/30/2016 02:39 PM   Modules accepted: Orders

## 2018-04-11 ENCOUNTER — Ambulatory Visit (INDEPENDENT_AMBULATORY_CARE_PROVIDER_SITE_OTHER): Payer: BLUE CROSS/BLUE SHIELD | Admitting: Sports Medicine

## 2018-04-11 DIAGNOSIS — R1013 Epigastric pain: Secondary | ICD-10-CM | POA: Diagnosis not present

## 2018-04-11 DIAGNOSIS — R635 Abnormal weight gain: Secondary | ICD-10-CM

## 2018-04-11 DIAGNOSIS — E782 Mixed hyperlipidemia: Secondary | ICD-10-CM | POA: Diagnosis not present

## 2018-04-11 MED ORDER — PANTOPRAZOLE SODIUM 40 MG PO TBEC: 40.0000 mg | DELAYED_RELEASE_TABLET | Freq: Every day | ORAL | 3 refills | Status: DC

## 2018-04-11 MED ORDER — DEXLANSOPRAZOLE 60 MG PO CPDR: 60.0000 mg | DELAYED_RELEASE_CAPSULE | Freq: Every day | ORAL | 11 refills | Status: DC

## 2018-04-11 NOTE — Progress Notes (Addendum)
Subjective:    CC: Epigastric pain  HPI: Jamie Watts is a pleasant 42 year old male, he recalls a couple of days ago having a meal, feeling epigastric fullness through the evening, severe pain and gnawing in the morning followed by dark and tarry stools x1.  Mild nausea, he did have melena but no hematochezia or hematemesis.  He does have a family history of H. pylori.  Pain was moderate, intermittent.  No constitutional symptoms.  I reviewed the past medical history, family history, social history, surgical history, and allergies today and no changes were needed.  Please see the problem list section below in epic for further details.  Past Medical History: No past medical history on file. Past Surgical History: No past surgical history on file. Social History: Social History   Socioeconomic History  . Marital status: Married    Spouse name: Not on file  . Number of children: Not on file  . Years of education: Not on file  . Highest education level: Not on file  Occupational History  . Not on file  Social Needs  . Financial resource strain: Not on file  . Food insecurity:    Worry: Not on file    Inability: Not on file  . Transportation needs:    Medical: Not on file    Non-medical: Not on file  Tobacco Use  . Smoking status: Never Smoker  . Smokeless tobacco: Never Used  Substance and Sexual Activity  . Alcohol use: No  . Drug use: No  . Sexual activity: Not on file  Lifestyle  . Physical activity:    Days per week: Not on file    Minutes per session: Not on file  . Stress: Not on file  Relationships  . Social connections:    Talks on phone: Not on file    Gets together: Not on file    Attends religious service: Not on file    Active member of club or organization: Not on file    Attends meetings of clubs or organizations: Not on file    Relationship status: Not on file  Other Topics Concern  . Not on file  Social History Narrative  . Not on file   Family  History: No family history on file. Allergies: No Known Allergies Medications: See med rec.  Review of Systems: No fevers, chills, night sweats, weight loss, chest pain, or shortness of breath.   Objective:    General: Well Developed, well nourished, and in no acute distress.  Neuro: Alert and oriented x3, extra-ocular muscles intact, sensation grossly intact.  HEENT: Normocephalic, atraumatic, pupils equal round reactive to light, neck supple, no masses, no lymphadenopathy, thyroid nonpalpable.  Skin: Warm and dry, no rashes. Cardiac: Regular rate and rhythm, no murmurs rubs or gallops, no lower extremity edema.  Respiratory: Clear to auscultation bilaterally. Not using accessory muscles, speaking in full sentences. Abdomen: Soft, tender to palpation in the epigastric region, nondistended, normal bowel sounds, no palpable masses, no guarding, rigidity, rebound tenderness.  H. pylori breath test obtained.  Impression and Recommendations:    Epigastric pain With an episode of dark tarry stools. Fasting today, no PPI use, H. pylori test today. CBC, CMP. Pantoprazole. Triple therapy/Prevpac if H. pylori positive. If anemic we will send him for upper endoscopy.  H. pylori test is positive, adding Prevpac.  This should eradicate it as a cure.  Annual physical exam Flu shot today.  Hyperlipidemia, mixed Starting low-dose atorvastatin, 20 mg, recheck in 3 months.  ___________________________________________ Gwen Her. Dianah Field, M.D., ABFM., CAQSM. Primary Care and Sports Medicine [facility]  Adjunct Professor of [employer] Medicine

## 2018-04-11 NOTE — Assessment & Plan Note (Signed)
Flu shot today 

## 2018-04-11 NOTE — Assessment & Plan Note (Addendum)
With an episode of dark tarry stools. Fasting today, no PPI use, H. pylori test today. CBC, CMP. Pantoprazole. Triple therapy/Prevpac if H. pylori positive. If anemic we will send him for upper endoscopy.  H. pylori test is positive, adding Prevpac.  This should eradicate it as a cure.

## 2018-04-12 DIAGNOSIS — E782 Mixed hyperlipidemia: Secondary | ICD-10-CM | POA: Insufficient documentation

## 2018-04-12 LAB — COMPREHENSIVE METABOLIC PANEL
AG Ratio: 2.1 (calc) (ref 1.0–2.5)
CO2: 28 mmol/L (ref 20–32)
Chloride: 103 mmol/L (ref 98–110)
Creat: 1.05 mg/dL (ref 0.60–1.35)
Sodium: 140 mmol/L (ref 135–146)
Total Protein: 7.2 g/dL (ref 6.1–8.1)

## 2018-04-12 LAB — AMYLASE: Amylase: 41 U/L (ref 21–101)

## 2018-04-12 LAB — CBC
HCT: 45.6 % (ref 38.5–50.0)
Hemoglobin: 15.5 g/dL (ref 13.2–17.1)
MCH: 29.5 pg (ref 27.0–33.0)
MCHC: 34 g/dL (ref 32.0–36.0)
MCV: 86.9 fL (ref 80.0–100.0)
MPV: 11.4 fL (ref 7.5–12.5)
Platelets: 363 Thousand/uL (ref 140–400)
RBC: 5.25 Million/uL (ref 4.20–5.80)
RDW: 13.3 % (ref 11.0–15.0)
WBC: 6.7 10*3/uL (ref 3.8–10.8)

## 2018-04-12 LAB — LIPASE: Lipase: 22 U/L (ref 7–60)

## 2018-04-12 LAB — LIPID PANEL W/REFLEX DIRECT LDL
Cholesterol: 202 mg/dL — ABNORMAL HIGH (ref <200)
HDL: 34 mg/dL — ABNORMAL LOW (ref >40)
LDL Cholesterol (Calc): 142 mg/dL (calc) — ABNORMAL HIGH
Non-HDL Cholesterol (Calc): 168 mg/dL — ABNORMAL HIGH (ref <130)
Total CHOL/HDL Ratio: 5.9 (calc) — ABNORMAL HIGH (ref <5.0)
Triglycerides: 137 mg/dL (ref <150)

## 2018-04-12 LAB — COMPREHENSIVE METABOLIC PANEL WITH GFR
ALT: 50 U/L — ABNORMAL HIGH (ref 9–46)
AST: 22 U/L (ref 10–40)
Albumin: 4.9 g/dL (ref 3.6–5.1)
Alkaline phosphatase (APISO): 68 U/L (ref 40–115)
BUN: 12 mg/dL (ref 7–25)
Calcium: 10.2 mg/dL (ref 8.6–10.3)
Globulin: 2.3 g/dL (ref 1.9–3.7)
Glucose, Bld: 104 mg/dL — ABNORMAL HIGH (ref 65–99)
Potassium: 4.5 mmol/L (ref 3.5–5.3)
Total Bilirubin: 1.2 mg/dL (ref 0.2–1.2)

## 2018-04-12 LAB — HEMOGLOBIN A1C
Hgb A1c MFr Bld: 5.8 % of total Hgb — ABNORMAL HIGH (ref <5.7)
Mean Plasma Glucose: 120 (calc)
eAG (mmol/L): 6.6 (calc)

## 2018-04-12 LAB — H. PYLORI BREATH TEST: H. pylori Breath Test: DETECTED — AB

## 2018-04-12 MED ORDER — ATORVASTATIN CALCIUM 20 MG PO TABS: 20.0000 mg | ORAL_TABLET | Freq: Every day | ORAL | 3 refills | Status: DC

## 2018-04-12 NOTE — Addendum Note (Signed)
 Addended by: Monica BectonHEKKEKANDAM,  J on: 04/12/2018 11:36 AM   Modules accepted: Orders

## 2018-04-12 NOTE — Assessment & Plan Note (Signed)
Starting low-dose atorvastatin, 20 mg, recheck in 3 months.

## 2018-04-13 MED ORDER — AMOXICILL-CLARITHRO-LANSOPRAZ PO MISC: Freq: Two times a day (BID) | ORAL | 0 refills | Status: DC

## 2018-04-13 NOTE — Addendum Note (Signed)
Addended by: Monica BectonHEKKEKANDAM, Rubin Dais J on: 04/13/2018 08:45 AM   Modules accepted: Orders

## 2018-06-04 ENCOUNTER — Other Ambulatory Visit: Payer: Self-pay | Admitting: Sports Medicine

## 2018-06-04 DIAGNOSIS — R1013 Epigastric pain: Secondary | ICD-10-CM

## 2018-12-05 DIAGNOSIS — Z1159 Encounter for screening for other viral diseases: Secondary | ICD-10-CM | POA: Diagnosis not present

## 2018-12-05 DIAGNOSIS — R030 Elevated blood-pressure reading, without diagnosis of hypertension: Secondary | ICD-10-CM | POA: Diagnosis not present

## 2020-05-08 ENCOUNTER — Encounter: Payer: BLUE CROSS/BLUE SHIELD | Admitting: Sports Medicine

## 2020-05-13 ENCOUNTER — Other Ambulatory Visit: Payer: Self-pay

## 2020-05-13 ENCOUNTER — Encounter: Payer: Self-pay | Admitting: Sports Medicine

## 2020-05-13 ENCOUNTER — Ambulatory Visit (INDEPENDENT_AMBULATORY_CARE_PROVIDER_SITE_OTHER): Payer: BC Managed Care – PPO | Admitting: Sports Medicine

## 2020-05-13 VITALS — BP 136/84 | HR 71 | Ht 70.0 in | Wt 218.0 lb

## 2020-05-13 DIAGNOSIS — Z0000 Encounter for general adult medical examination without abnormal findings: Secondary | ICD-10-CM

## 2020-05-13 DIAGNOSIS — L918 Other hypertrophic disorders of the skin: Secondary | ICD-10-CM

## 2020-05-13 DIAGNOSIS — E782 Mixed hyperlipidemia: Secondary | ICD-10-CM | POA: Diagnosis not present

## 2020-05-13 NOTE — Assessment & Plan Note (Signed)
Annual physical as above, checking routine labs. 

## 2020-05-13 NOTE — Progress Notes (Signed)
Subjective:    CC: Annual Physical Exam  HPI:  This patient is here for their annual physical  I reviewed the past medical history, family history, social history, surgical history, and allergies today and no changes were needed.  Please see the problem list section below in epic for further details.  Past Medical History: No past medical history on file. Past Surgical History: No past surgical history on file. Social History: Social History   Socioeconomic History   Marital status: Married    Spouse name: Not on file   Number of children: Not on file   Years of education: Not on file   Highest education level: Not on file  Occupational History   Not on file  Tobacco Use   Smoking status: Never Smoker   Smokeless tobacco: Never Used  Substance and Sexual Activity   Alcohol use: No   Drug use: No   Sexual activity: Not on file  Other Topics Concern   Not on file  Social History Narrative   Not on file   Social Determinants of Health   Financial Resource Strain: Not on file  Food Insecurity: Not on file  Transportation Needs: Not on file  Physical Activity: Not on file  Stress: Not on file  Social Connections: Not on file   Family History: No family history on file. Allergies: No Known Allergies Medications: See med rec.  Review of Systems: No headache, visual changes, nausea, vomiting, diarrhea, constipation, dizziness, abdominal pain, skin rash, fevers, chills, night sweats, swollen lymph nodes, weight loss, chest pain, body aches, joint swelling, muscle aches, shortness of breath, mood changes, visual or auditory hallucinations.  Objective:    General: Well Developed, well nourished, and in no acute distress.  Neuro: Alert and oriented x3, extra-ocular muscles intact, sensation grossly intact. Cranial nerves II through XII are intact, motor, sensory, and coordinative functions are all intact. HEENT: Normocephalic, atraumatic, pupils equal round  reactive to light, neck supple, no masses, no lymphadenopathy, thyroid nonpalpable. Oropharynx, nasopharynx, external ear canals are unremarkable. Skin: Warm and dry, no rashes noted. Skin tag on the right low back, large pedunculated skin tag right axilla. Cardiac: Regular rate and rhythm, no murmurs rubs or gallops.  Respiratory: Clear to auscultation bilaterally. Not using accessory muscles, speaking in full sentences.  Abdominal: Soft, nontender, nondistended, positive bowel sounds, no masses, no organomegaly.  Musculoskeletal: Shoulder, elbow, wrist, hip, knee, ankle stable, and with full range of motion.  Procedure:  Excision of large pedunculated right axillary skin tag Risks, benefits, and alternatives explained and consent obtained. Time out conducted. Surface prepped with alcohol. 1cc lidocaine with epinephine infiltrated in a field block. Adequate anesthesia ensured. Area prepped and draped in a sterile fashion. Excision performed with: Hemostat used to clamp the stalk, skin tag cut and then Hyfrecator used to achieve hemostasis. Hemostasis achieved. Pt stable.  Procedure:  Cryodestruction of right low back skin tag Consent obtained and verified. Time-out conducted. Noted no overlying erythema, induration, or other signs of local infection. Completed without difficulty using Cryo-Gun. Advised to call if fevers/chills, erythema, induration, drainage, or persistent bleeding.  Impression and Recommendations:    The patient was counselled, risk factors were discussed, anticipatory guidance given.  Annual physical exam Annual physical as above, checking routine labs.   Skin tag Cryotherapy of a skin tag on the right low back, full excision of a large pedunculated skin tag in the right axilla. Return in 2 weeks for wound check.   ___________________________________________ Ihor Austin.  Benjamin Stain, M.D., ABFM., CAQSM. Primary Care and Sports Medicine [facility]  [facility]  Adjunct Professor of Family Medicine  [employer]

## 2020-05-13 NOTE — Assessment & Plan Note (Signed)
Cryotherapy of a skin tag on the right low back, full excision of a large pedunculated skin tag in the right axilla. Return in 2 weeks for wound check.

## 2020-05-14 LAB — LIPID PANEL
Cholesterol: 201 mg/dL — ABNORMAL HIGH (ref <200)
HDL: 43 mg/dL (ref >=40)
LDL Cholesterol (Calc): 135 mg/dL (calc) — ABNORMAL HIGH
Non-HDL Cholesterol (Calc): 158 mg/dL (calc) — ABNORMAL HIGH (ref <130)
Total CHOL/HDL Ratio: 4.7 (calc) (ref <5.0)
Triglycerides: 124 mg/dL (ref <150)

## 2020-05-14 LAB — COMPREHENSIVE METABOLIC PANEL
AG Ratio: 1.9 (calc) (ref 1.0–2.5)
ALT: 40 U/L (ref 9–46)
AST: 30 U/L (ref 10–40)
Albumin: 4.8 g/dL (ref 3.6–5.1)
Alkaline phosphatase (APISO): 80 U/L (ref 36–130)
BUN: 12 mg/dL (ref 7–25)
CO2: 27 mmol/L (ref 20–32)
Calcium: 10 mg/dL (ref 8.6–10.3)
Chloride: 102 mmol/L (ref 98–110)
Creat: 0.92 mg/dL (ref 0.60–1.35)
Globulin: 2.5 g/dL (calc) (ref 1.9–3.7)
Glucose, Bld: 86 mg/dL (ref 65–99)
Potassium: 4.5 mmol/L (ref 3.5–5.3)
Sodium: 140 mmol/L (ref 135–146)
Total Bilirubin: 1 mg/dL (ref 0.2–1.2)
Total Protein: 7.3 g/dL (ref 6.1–8.1)

## 2020-05-14 LAB — CBC
HCT: 46.4 % (ref 38.5–50.0)
Hemoglobin: 15.9 g/dL (ref 13.2–17.1)
MCH: 30.8 pg (ref 27.0–33.0)
MCHC: 34.3 g/dL (ref 32.0–36.0)
MCV: 89.9 fL (ref 80.0–100.0)
MPV: 11 fL (ref 7.5–12.5)
Platelets: 360 10*3/uL (ref 140–400)
RBC: 5.16 10*6/uL (ref 4.20–5.80)
RDW: 12.7 % (ref 11.0–15.0)
WBC: 6.9 10*3/uL (ref 3.8–10.8)

## 2020-05-14 LAB — HEMOGLOBIN A1C
Hgb A1c MFr Bld: 5.6 % of total Hgb (ref <5.7)
Mean Plasma Glucose: 114 mg/dL
eAG (mmol/L): 6.3 mmol/L

## 2020-05-14 LAB — TSH: TSH: 1.1 mIU/L (ref 0.40–4.50)

## 2020-06-19 ENCOUNTER — Other Ambulatory Visit: Payer: Self-pay

## 2020-06-19 ENCOUNTER — Ambulatory Visit (INDEPENDENT_AMBULATORY_CARE_PROVIDER_SITE_OTHER): Payer: BC Managed Care – PPO

## 2020-06-19 ENCOUNTER — Ambulatory Visit (INDEPENDENT_AMBULATORY_CARE_PROVIDER_SITE_OTHER): Payer: BC Managed Care – PPO | Admitting: Sports Medicine

## 2020-06-19 DIAGNOSIS — S62316A Displaced fracture of base of fifth metacarpal bone, right hand, initial encounter for closed fracture: Secondary | ICD-10-CM | POA: Diagnosis not present

## 2020-06-19 DIAGNOSIS — S62339A Displaced fracture of neck of unspecified metacarpal bone, initial encounter for closed fracture: Secondary | ICD-10-CM

## 2020-06-19 NOTE — Patient Instructions (Signed)
Boxer's Fracture  A boxer's fracture is a break (fracture) in the bone that connects your little finger to your wrist (fifth metacarpal). This type of fracture usually happens at the end of the bone, closest to the little finger. The knuckle is often pushed down toward the wrist by the impact. What are the causes? This condition may be caused by:  Hitting an object with a clenched fist.  A hard, direct hit to the hand.  An injury that crushes the hand. What increases the risk? You are more likely to develop this condition if:  You are in a fistfight.  You have certain bone diseases, such as osteoporosis. What are the signs or symptoms? Symptoms of a boxer's fracture develop immediately after the injury. They may include:  Pain, which may get worse when your little finger is moved or your hand is touched.  Bruising around the knuckle.  Swelling of your hand.  Your little finger being in an abnormal position.  The knuckle on the finger looking indented.  Not being able to move the finger.  A shortened finger. How is this diagnosed? This injury may be diagnosed based on:  Your symptoms. Your health care provider may ask about any recent hand injury.  A physical exam.  X-rays to confirm the diagnosis. How is this treated? Treatment for this condition depends on how severe the injury is. Possible treatments include:  Closed reduction. This treatment may be used if your bone is stable. The health care provider uses pressure and rotation to move the bones back into place without cutting your skin open. You would need to wear a cast or splint to help keep your bones in place while they heal.  Open reduction with internal fixation (ORIF). This may be needed if your fracture is far out of place or goes through the joint surface of the bone or through the skin. This treatment involves open surgery to move your bones back into the right position. Screws, wires, or plates may be used  to make the fracture stable. You may also need to:  Wear a cast or a splint for several weeks.  Have follow-up X-rays to make sure that the bone is healing well and staying in position.  Have physical therapy after your cast or splint is removed. This will help you regain full movement (range of motion) and strength in your hand. Follow these instructions at home: Medicines  Take over-the-counter and prescription medicines only as told by your health care provider.  Ask your health care provider if the medicine prescribed to you: ? Requires you to avoid driving or using machinery. ? Can cause constipation. You may need to take these actions to prevent or treat constipation:  Drink enough fluid to keep your urine pale yellow.  Take over-the-counter or prescription medicines.  Eat foods that are high in fiber, such as beans, whole grains, and fresh fruits and vegetables.  Limit foods that are high in fat and processed sugars, such as fried or sweet foods. If you have a splint:  Wear the splint as told by your health care provider. Remove it only as told by your health care provider.  Check the skin around the splint every day. Tell your health care provider about any concerns.  Loosen the splint if your fingers tingle, become numb, or turn cold and blue.  Keep the splint clean and dry. If you have a cast:  Do not put pressure on any part of the cast until  it is fully hardened. This may take several hours.  Do not stick anything inside the cast to scratch your skin. Doing that increases your risk of infection.  Check the skin around the cast every day. Tell your health care provider about any concerns.  You may put lotion on dry skin around the edges of the cast. Do not put lotion on the skin underneath the cast.  Keep the cast clean and dry. Bathing If the splint or cast is not waterproof:  Do not let it get wet.  Cover it with a watertight covering when you take a bath  or shower. Managing pain, stiffness, and swelling  If directed, put ice on the injured area. To do this: ? If you have a removable splint, remove it as told by your health care provider. ? Put ice in a plastic bag. ? Place a towel between your skin and the bag or between your cast and the bag. ? Leave the ice on for 20 minutes, 2-3 times a day. ? Remove the ice if your skin turns bright red. This is very important. If you cannot feel pain, heat, or cold, you have a greater risk of damage to the area.  Move your fingers often to reduce stiffness and swelling.  Raise (elevate) the injured hand above the level of your heart while you are sitting or lying down. General instructions  Ask your health care provider when it is safe to drive if you have a cast or splint on your hand.  Do not use any products that contain nicotine or tobacco. These products include cigarettes, chewing tobacco, and vaping devices, such as e-cigarettes. These can delay bone healing. If you need help quitting, ask your health care provider.  Return to your normal activities as told by your health care provider. Ask your health care provider what activities are safe for you.  Keep all follow-up visits. This is important. Contact a health care provider if:  Your pain is getting worse.  You have more redness, swelling, or pain in the injured area.  You have a fever.  Your cast or splint feels too tight or too loose.  Your cast is coming apart or breaking.  You develop a rash. Get help right away if:  You have severe pain.  Your skin or nails on your injured hand turn blue or gray even after you loosen your splint.  Your injured hand feels cold or becomes numb even after you loosen your splint. Summary  A boxer's fracture is a break in the bone that connects your little finger to your wrist.  You may have X-rays to confirm the diagnosis.  Treatment depends on how severe your injury is and may include  wearing a cast or splint or having surgery. This information is not intended to replace advice given to you by your health care provider. Make sure you discuss any questions you have with your health care provider. Document Revised: 02/20/2020 Document Reviewed: 02/20/2020 Elsevier Patient Education  2021 [employer].

## 2020-06-19 NOTE — Assessment & Plan Note (Signed)
2 weeks ago Renner swung a punch, trying to the back of his chair and missed, and hit the hard plastic part. Immediate pain, swelling, bruising. He still has pain at the neck of the fifth metacarpal, and it does look like he sustained a boxer's fracture. Pain is mild enough right now where he does not need narcotic, we did fashion a custom fiberglass ulnar gutter splint, getting some x-rays, return to see me in about 3 to 4 weeks.

## 2020-06-19 NOTE — Progress Notes (Signed)
    Procedures performed today:    Ulnar gutter splint applied.  Independent interpretation of notes and tests performed by another provider:   None.  Brief History, Exam, Impression, and Recommendations:    Boxer's fracture, right 2 weeks ago Lathyn swung a punch, trying to the back of his chair and missed, and hit the hard plastic part. Immediate pain, swelling, bruising. He still has pain at the neck of the fifth metacarpal, and it does look like he sustained a boxer's fracture. Pain is mild enough right now where he does not need narcotic, we did fashion a custom fiberglass ulnar gutter splint, getting some x-rays, return to see me in about 3 to 4 weeks.    ___________________________________________ Ihor Austin. Benjamin Stain, M.D., ABFM., CAQSM. Primary Care and Sports Medicine [facility]  Adjunct Instructor of Family Medicine  [employer]

## 2020-07-10 ENCOUNTER — Other Ambulatory Visit: Payer: Self-pay

## 2020-07-10 ENCOUNTER — Ambulatory Visit: Payer: BC Managed Care – PPO | Admitting: Sports Medicine

## 2020-07-10 DIAGNOSIS — S62339D Displaced fracture of neck of unspecified metacarpal bone, subsequent encounter for fracture with routine healing: Secondary | ICD-10-CM | POA: Diagnosis not present

## 2020-07-10 NOTE — Assessment & Plan Note (Signed)
3 weeks post fracture of the fifth metacarpal base on the right, we are going to discontinue the ulnar gutter splint today. He has surprisingly good motion, surprisingly good strength and really no tenderness to palpation over the fracture. I am okay with a 2 pound lifting restriction, and he will work on range of motion exercises, return to see me in 1 month, x-ray before visit.

## 2020-07-10 NOTE — Progress Notes (Signed)
    Procedures performed today:    None.  Independent interpretation of notes and tests performed by another provider:   None.  Brief History, Exam, Impression, and Recommendations:    Boxer's fracture, right 3 weeks post fracture of the fifth metacarpal base on the right, we are going to discontinue the ulnar gutter splint today. He has surprisingly good motion, surprisingly good strength and really no tenderness to palpation over the fracture. I am okay with a 2 pound lifting restriction, and he will work on range of motion exercises, return to see me in 1 month, x-ray before visit.    ___________________________________________ Ihor Austin. Benjamin Stain, M.D., ABFM., CAQSM. Primary Care and Sports Medicine [facility]  Adjunct Instructor of Family Medicine  [employer]

## 2020-08-07 ENCOUNTER — Ambulatory Visit: Payer: BC Managed Care – PPO | Admitting: Sports Medicine

## 2020-08-07 ENCOUNTER — Ambulatory Visit (INDEPENDENT_AMBULATORY_CARE_PROVIDER_SITE_OTHER): Payer: BC Managed Care – PPO

## 2020-08-07 ENCOUNTER — Other Ambulatory Visit: Payer: Self-pay

## 2020-08-07 DIAGNOSIS — S62306D Unspecified fracture of fifth metacarpal bone, right hand, subsequent encounter for fracture with routine healing: Secondary | ICD-10-CM

## 2020-08-07 DIAGNOSIS — S62346D Nondisplaced fracture of base of fifth metacarpal bone, right hand, subsequent encounter for fracture with routine healing: Secondary | ICD-10-CM | POA: Diagnosis not present

## 2020-08-07 DIAGNOSIS — S62316A Displaced fracture of base of fifth metacarpal bone, right hand, initial encounter for closed fracture: Secondary | ICD-10-CM | POA: Diagnosis not present

## 2020-08-07 DIAGNOSIS — S62339D Displaced fracture of neck of unspecified metacarpal bone, subsequent encounter for fracture with routine healing: Secondary | ICD-10-CM

## 2020-08-07 NOTE — Progress Notes (Signed)
    Procedures performed today:    None.  Independent interpretation of notes and tests performed by another provider:   I did personally review his x-rays, there is good blurring of the fracture lines but they are still visible.  Brief History, Exam, Impression, and Recommendations:    Closed fracture of base of fifth metacarpal bone of right hand Jamie Watts returns, he is about 6 weeks post comminuted fracture of the base of his right fifth metacarpal. He is doing really well, minimal tenderness over the fracture, I think he needs at least another month before going back to boxing, he does have good motion and good strength so I think it is okay for him to get back into the gym. He will need to securely taped his hand and wrist if he wants to go back to boxing, he does understand this could take another month or 2 for the pain to fully disappear.    ___________________________________________ Jamie Watts, M.D., ABFM., CAQSM. Primary Care and Sports Medicine [facility]  Adjunct Instructor of Family Medicine  [employer]

## 2020-08-07 NOTE — Assessment & Plan Note (Signed)
Jamie Watts returns, he is about 6 weeks post comminuted fracture of the base of his right fifth metacarpal. He is doing really well, minimal tenderness over the fracture, I think he needs at least another month before going back to boxing, he does have good motion and good strength so I think it is okay for him to get back into the gym. He will need to securely taped his hand and wrist if he wants to go back to boxing, he does understand this could take another month or 2 for the pain to fully disappear.

## 2021-01-06 ENCOUNTER — Ambulatory Visit: Payer: BC Managed Care – PPO | Admitting: Physician Assistant

## 2021-01-06 ENCOUNTER — Encounter: Payer: Self-pay | Admitting: Physician Assistant

## 2021-01-06 VITALS — BP 130/73 | HR 86 | Ht 70.0 in | Wt 190.0 lb

## 2021-01-06 DIAGNOSIS — K4090 Unilateral inguinal hernia, without obstruction or gangrene, not specified as recurrent: Secondary | ICD-10-CM | POA: Diagnosis not present

## 2021-01-06 NOTE — Patient Instructions (Signed)
Inguinal Hernia, Adult An inguinal hernia develops when fat or the intestines push through a weak spot in a muscle where the leg meets the lower abdomen (groin). This creates a bulge. This kind of hernia could also be: In the scrotum, if you are male. In folds of skin around the vagina, if you are male. There are three types of inguinal hernias: Hernias that can be pushed back into the abdomen (are reducible). This type rarely causes pain. Hernias that are not reducible (are incarcerated). Hernias that are not reducible and lose their blood supply (are strangulated). This type of hernia requires emergency surgery. What are the causes? This condition is caused by having a weak spot in the muscles or tissues in your groin. This develops over time. The hernia may poke through the weak spot when you suddenly strain your lower abdominal muscles, such as when you: Lift a heavy object. Strain to have a bowel movement. Constipation can lead to straining. Cough. What increases the risk? This condition is more likely to develop in: Males. Pregnant females. People who: Are overweight. Work in jobs that require long periods of standing or heavy lifting. Have had an inguinal hernia before. Smoke or have lung disease. These factors can lead to long-term (chronic) coughing. What are the signs or symptoms? Symptoms may depend on the size of the hernia. Often, a small inguinal hernia has no symptoms. Symptoms of a larger hernia may include: A bulge in the groin area. This is easier to see when standing. It might not be visible when lying down. Pain or burning in the groin. This may get worse when lifting, straining, or coughing. A dull ache or a feeling of pressure in the groin. An unusual bulge in the scrotum, in males. Symptoms of a strangulated inguinal hernia may include: A bulge in your groin that is very painful and tender to the touch. A bulge that turns red or purple. Fever, nausea, and  vomiting. Inability to have a bowel movement or to pass gas. How is this diagnosed? This condition is diagnosed based on your symptoms, your medical history, and a physical exam. Your health care provider may feel your groin area and ask youto cough. How is this treated? Treatment depends on the size of your hernia and whether you have symptoms. If you do not have symptoms, your health care provider may have you watch your hernia carefully and have you come in for follow-up visits. If your hernia islarge or if you have symptoms, you may need surgery to repair the hernia. Follow these instructions at home: Lifestyle Avoid lifting heavy objects. Avoid standing for long periods of time. Do not use any products that contain nicotine or tobacco. These products include cigarettes, chewing tobacco, and vaping devices, such as e-cigarettes. If you need help quitting, ask your health care provider. Maintain a healthy weight. Preventing constipation You may need to take these actions to prevent or treat constipation: Drink enough fluid to keep your urine pale yellow. Take over-the-counter or prescription medicines. Eat foods that are high in fiber, such as beans, whole grains, and fresh fruits and vegetables. Limit foods that are high in fat and processed sugars, such as fried or sweet foods. General instructions You may try to push the hernia back in place by very gently pressing on it while lying down. Do not try to force the bulge back in if it will not push in easily. Watch your hernia for any changes in shape, size, or color. Get  help right away if you notice any changes. Take over-the-counter and prescription medicines only as told by your health care provider. Keep all follow-up visits. This is important. Contact a health care provider if: You have a fever or chills. You develop new symptoms. Your symptoms get worse. Get help right away if: You have pain in your groin that suddenly gets  worse. You have a bulge in your groin that: Suddenly gets bigger and does not get smaller. Becomes red or purple or painful to the touch. You are a man and you have a sudden pain in your scrotum, or the size of your scrotum suddenly changes. You cannot push the hernia back in place by very gently pressing on it when you are lying down. You have nausea or vomiting that does not go away. You have a fast heartbeat. You cannot have a bowel movement or pass gas. These symptoms may represent a serious problem that is an emergency. Do not wait to see if the symptoms will go away. Get medical help right away. Call your local emergency services (911 in the [place] Summary An inguinal hernia develops when fat or the intestines push through a weak spot in a muscle where your leg meets your lower abdomen (groin). This condition is caused by having a weak spot in muscles or tissues in your groin. Symptoms may depend on the size of the hernia, and they may include pain or swelling in your groin. A small inguinal hernia often has no symptoms. Treatment may not be needed if you do not have symptoms. If you have symptoms or a large hernia, you may need surgery to repair the hernia. Avoid lifting heavy objects. Also, avoid standing for long periods of time. This information is not intended to replace advice given to you by your health care provider. Make sure you discuss any questions you have with your healthcare provider. Document Revised: 01/15/2020 Document Reviewed: 01/15/2020 Elsevier Patient Education  2022 [employer] Hernia, Adult     A hernia happens when tissue inside your body pushes out through a weak spot in your belly muscles (abdominal wall). This makes a round lump (bulge). The lump may be: In a scar from surgery that was done in your belly (incisional hernia). Near your belly button (umbilical hernia). In your groin (inguinal hernia). Your groin is the area where your leg meets your lower  belly (abdomen). This kind of hernia could also be: In your scrotum, if you are male. In folds of skin around your vagina, if you are male. In your upper thigh (femoral hernia). Inside your belly (hiatal hernia). This happens when your stomach slides above the muscle between your belly and your chest (diaphragm). If your hernia is small and it does not cause pain, you may not need treatment.If your hernia is large or it causes pain, you may need surgery. Follow these instructions at home: Activity Avoid stretching or overusing (straining) the muscles near your hernia. Straining can happen when you: Lift something heavy. Poop (have a bowel movement). Do not lift anything that is heavier than 10 lb (4.5 kg), or the limit that you are told, until your doctor says that it is safe. Use the strength of your legs when you lift something heavy. Do not use only your back muscles to lift. General instructions Do these things if told by your doctor so you do not have trouble pooping (constipation): Drink enough fluid to keep your pee (urine) pale yellow. Eat foods that are  high in fiber. These include fresh fruits and vegetables, whole grains, and beans. Limit foods that are high in fat and processed sugars. These include foods that are fried or sweet. Take medicine for trouble pooping. When you cough, try to cough gently. You may try to push your hernia in by very gently pressing on it when you are lying down. Do not try to force the bulge back in if it will not push in easily. If you are overweight, work with your doctor to lose weight safely. Do not use any products that have nicotine or tobacco in them. These include cigarettes and e-cigarettes. If you need help quitting, ask your doctor. If you will be having surgery (hernia repair), watch your hernia for changes in shape, size, or color. Tell your doctor if you see any changes. Take over-the-counter and prescription medicines only as told by  your doctor. Keep all follow-up visits as told by your doctor. Contact a doctor if: You get new pain, swelling, or redness near your hernia. You poop fewer times in a week than normal. You have trouble pooping. You have poop (stool) that is more dry than normal. You have poop that is harder or larger than normal. Get help right away if: You have a fever. You have belly pain that gets worse. You feel sick to your stomach (nauseous). You throw up (vomit). Your hernia cannot be pushed in by very gently pressing on it when you are lying down. Do not try to force the bulge back in if it will not push in easily. Your hernia: Changes in shape or size. Changes color. Feels hard or it hurts when you touch it. These symptoms may represent a serious problem that is an emergency. Do not wait to see if the symptoms will go away. Get medical help right away. Call your local emergency services (911 in the [place] Summary A hernia happens when tissue inside your body pushes out through a weak spot in the belly muscles. This creates a bulge. If your hernia is small and it does not hurt, you may not need treatment. If your hernia is large or it hurts, you may need surgery. If you will be having surgery, watch your hernia for changes in shape, size, or color. Tell your doctor about any changes. This information is not intended to replace advice given to you by your health care provider. Make sure you discuss any questions you have with your healthcare provider. Document Revised: 09/07/2018 Document Reviewed: 02/16/2017 Elsevier Patient Education  2021 [employer].

## 2021-01-06 NOTE — Progress Notes (Signed)
   Subjective:    Patient ID: Audiel Scheiber, male    DOB: 1976/01/04, 45 y.o.   MRN: [id number]  HPI Pt is a 45 yo male who presents to the clinic with 30 days of left groin bulge and discomfort after bowling one day. He ignored it for a while but not becoming more noticeable and some discomfort associated. He does not do any regular heavy lifting. No pain with urination or radiating pain to testicle. Never had anything like this before. Notice more discomfort with coughing.   .. 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     Objective:   Physical Exam Vitals reviewed. Exam conducted with a chaperone present.  Constitutional:      Appearance: Normal appearance.  Cardiovascular:     Rate and Rhythm: Normal rate.  Pulmonary:     Effort: Pulmonary effort is normal.  Abdominal:     General: Bowel sounds are normal. There is no distension.     Palpations: Abdomen is soft.     Tenderness: There is no right CVA tenderness, left CVA tenderness or guarding.     Hernia: A hernia is present.     Comments: Some discomfort with palpation over left inguinal region reducible bulge approximately 5cm by 3cm.   Genitourinary:    Testes: Normal.     Comments: External and internal ring bulge noted.  Neurological:     General: No focal deficit present.     Mental Status: He is alert and oriented to person, place, and time.   Psychiatric:        Mood and Affect: Mood normal.          Assessment & Plan:  Marland KitchenMarland KitchenDelfin was seen today for hernia.  Diagnoses and all orders for this visit:  Left inguinal hernia -     Ambulatory referral to General Surgery  Discussed red flags of hernias. Made surgery referral. HO given. Avoid any heavy lifting.

## 2021-01-26 ENCOUNTER — Telehealth: Payer: Self-pay | Admitting: Neurology

## 2021-01-26 NOTE — Telephone Encounter (Signed)
Patient left a vm stating he has not heard from [facility] about an appt to evaluate his hernia.   He called them and they told him that they did not receive a referral from [place].   It looks like Arline Asp wrote that she sent this on 01/07/2021. Can you check on this for the patient?   His call back number is [phone].

## 2021-01-27 NOTE — Telephone Encounter (Signed)
Patient made aware.

## 2021-01-27 NOTE — Telephone Encounter (Signed)
Refaxing referral and ov notes and insurance now to [facility] at Fax [phone] and their phone is [phone]. Thank you

## 2021-02-05 DIAGNOSIS — R1909 Other intra-abdominal and pelvic swelling, mass and lump: Secondary | ICD-10-CM | POA: Diagnosis not present
